# Patient Record
Sex: Female | Born: 1990 | Race: White | Hispanic: No | Marital: Single | Smoking: Current every day smoker
Health system: Southern US, Community
[De-identification: ages and names within clinical notes are randomized; demographics above are authoritative.]

## PROBLEM LIST (undated history)

## (undated) DIAGNOSIS — J45909 Unspecified asthma, uncomplicated: Secondary | ICD-10-CM

## (undated) HISTORY — PX: EYE SURGERY: SHX253

---

## 2018-07-16 ENCOUNTER — Other Ambulatory Visit: Payer: Self-pay | Admitting: Obstetrics & Gynecology

## 2018-09-01 ENCOUNTER — Inpatient Hospital Stay (HOSPITAL_COMMUNITY): Payer: Self-pay

## 2018-09-01 ENCOUNTER — Inpatient Hospital Stay (HOSPITAL_COMMUNITY)
Admission: AD | Admit: 2018-09-01 | Discharge: 2018-09-01 | Disposition: A | Discharge status: Home / Self Care | Payer: Self-pay | Attending: Obstetrics and Gynecology | Admitting: Obstetrics and Gynecology

## 2018-09-01 ENCOUNTER — Encounter (HOSPITAL_COMMUNITY): Payer: Self-pay

## 2018-09-01 ENCOUNTER — Other Ambulatory Visit: Payer: Self-pay

## 2018-09-01 DIAGNOSIS — O468X1 Other antepartum hemorrhage, first trimester: Secondary | ICD-10-CM

## 2018-09-01 DIAGNOSIS — O3680X0 Pregnancy with inconclusive fetal viability, not applicable or unspecified: Secondary | ICD-10-CM

## 2018-09-01 DIAGNOSIS — Z3A11 11 weeks gestation of pregnancy: Secondary | ICD-10-CM | POA: Insufficient documentation

## 2018-09-01 DIAGNOSIS — O209 Hemorrhage in early pregnancy, unspecified: Secondary | ICD-10-CM

## 2018-09-01 DIAGNOSIS — O99331 Smoking (tobacco) complicating pregnancy, first trimester: Secondary | ICD-10-CM | POA: Insufficient documentation

## 2018-09-01 DIAGNOSIS — F17210 Nicotine dependence, cigarettes, uncomplicated: Secondary | ICD-10-CM | POA: Insufficient documentation

## 2018-09-01 DIAGNOSIS — O418X10 Other specified disorders of amniotic fluid and membranes, first trimester, not applicable or unspecified: Secondary | ICD-10-CM

## 2018-09-01 DIAGNOSIS — O208 Other hemorrhage in early pregnancy: Secondary | ICD-10-CM | POA: Insufficient documentation

## 2018-09-01 HISTORY — DX: Unspecified asthma, uncomplicated: J45.909

## 2018-09-01 LAB — CBC WITH DIFFERENTIAL/PLATELET
Abs Immature Granulocytes: 0.03 10*3/uL (ref 0.00–0.07)
Basophils Absolute: 0 10*3/uL (ref 0.0–0.1)
Basophils Relative: 1 %
Eosinophils Absolute: 0 10*3/uL (ref 0.0–0.5)
Eosinophils Relative: 0 %
HCT: 38.1 % (ref 36.0–46.0)
Hemoglobin: 12.8 g/dL (ref 12.0–15.0)
Immature Granulocytes: 0 %
Lymphocytes Relative: 25 %
Lymphs Abs: 2.2 10*3/uL (ref 0.7–4.0)
MCH: 30.5 pg (ref 26.0–34.0)
MCHC: 33.6 g/dL (ref 30.0–36.0)
MCV: 90.7 fL (ref 80.0–100.0)
Monocytes Absolute: 0.6 10*3/uL (ref 0.1–1.0)
Monocytes Relative: 6 %
Neutro Abs: 5.7 10*3/uL (ref 1.7–7.7)
Neutrophils Relative %: 68 %
Platelets: 243 10*3/uL (ref 150–400)
RBC: 4.2 MIL/uL (ref 3.87–5.11)
RDW: 12.5 % (ref 11.5–15.5)
WBC: 8.6 10*3/uL (ref 4.0–10.5)
nRBC: 0 % (ref 0.0–0.2)

## 2018-09-01 LAB — URINALYSIS, MICROSCOPIC (REFLEX): Bacteria, UA: NONE SEEN

## 2018-09-01 LAB — COMPREHENSIVE METABOLIC PANEL
ALT: 15 U/L (ref 0–44)
AST: 18 U/L (ref 15–41)
Albumin: 3.8 g/dL (ref 3.5–5.0)
Alkaline Phosphatase: 64 U/L (ref 38–126)
Anion gap: 11 (ref 5–15)
BUN: 7 mg/dL (ref 6–20)
CO2: 22 mmol/L (ref 22–32)
Calcium: 9.3 mg/dL (ref 8.9–10.3)
Chloride: 104 mmol/L (ref 98–111)
Creatinine, Ser: 0.7 mg/dL (ref 0.44–1.00)
GFR calc Af Amer: >60 mL/min (ref >60)
GFR calc non Af Amer: >60 mL/min (ref >60)
Glucose, Bld: 106 mg/dL — ABNORMAL HIGH (ref 70–99)
Potassium: 3.7 mmol/L (ref 3.5–5.1)
Sodium: 137 mmol/L (ref 135–145)
Total Bilirubin: 1 mg/dL (ref 0.3–1.2)
Total Protein: 6.4 g/dL — ABNORMAL LOW (ref 6.5–8.1)

## 2018-09-01 LAB — URINALYSIS, ROUTINE W REFLEX MICROSCOPIC
Bilirubin Urine: NEGATIVE
Glucose, UA: NEGATIVE mg/dL
Ketones, ur: NEGATIVE mg/dL
Nitrite: NEGATIVE
Protein, ur: NEGATIVE mg/dL
Specific Gravity, Urine: 1.02 (ref 1.005–1.030)
pH: 7 (ref 5.0–8.0)

## 2018-09-01 LAB — ABO/RH: ABO/RH(D): O POS

## 2018-09-01 LAB — HCG, QUANTITATIVE, PREGNANCY: hCG, Beta Chain, Quant, S: 10037 m[IU]/mL — ABNORMAL HIGH (ref <5)

## 2018-09-01 NOTE — Discharge Instructions (Signed)
Subchorionic Hematoma ° °A subchorionic hematoma is a gathering of blood between the outer wall of the embryo (chorion) and the inner wall of the womb (uterus). °This condition can cause vaginal bleeding. If they cause little or no vaginal bleeding, early small hematomas usually shrink on their own and do not affect your baby or pregnancy. When bleeding starts later in pregnancy, or if the hematoma is larger or occurs in older pregnant women, the condition may be more serious. Larger hematomas may get bigger, which increases the chances of miscarriage. This condition also increases the risk of: °· Premature separation of the placenta from the uterus. °· Premature (preterm) labor. °· Stillbirth. °What are the causes? °The exact cause of this condition is not known. It occurs when blood is trapped between the placenta and the uterine wall because the placenta has separated from the original site of implantation. °What increases the risk? °You are more likely to develop this condition if: °· You were treated with fertility medicines. °· You conceived through in vitro fertilization (IVF). °What are the signs or symptoms? °Symptoms of this condition include: °· Vaginal spotting or bleeding. °· Contractions of the uterus. These cause abdominal pain. °Sometimes you may have no symptoms and the bleeding may only be seen when ultrasound images are taken (transvaginal ultrasound). °How is this diagnosed? °This condition is diagnosed based on a physical exam. This includes a pelvic exam. You may also have other tests, including: °· Blood tests. °· Urine tests. °· Ultrasound of the abdomen. °How is this treated? °Treatment for this condition can vary. Treatment may include: °· Watchful waiting. You will be monitored closely for any changes in bleeding. During this stage: °? The hematoma may be reabsorbed by the body. °? The hematoma may separate the fluid-filled space containing the embryo (gestational sac) from the wall of the  womb (endometrium). °· Medicines. °· Activity restriction. This may be needed until the bleeding stops. °Follow these instructions at home: °· Stay on bed rest if told to do so by your health care provider. °· Do not lift anything that is heavier than 10 lbs. (4.5 kg) or as told by your health care provider. °· Do not use any products that contain nicotine or tobacco, such as cigarettes and e-cigarettes. If you need help quitting, ask your health care provider. °· Track and write down the number of pads you use each day and how soaked (saturated) they are. °· Do not use tampons. °· Keep all follow-up visits as told by your health care provider. This is important. Your health care provider may ask you to have follow-up blood tests or ultrasound tests or both. °Contact a health care provider if: °· You have any vaginal bleeding. °· You have a fever. °Get help right away if: °· You have severe cramps in your stomach, back, abdomen, or pelvis. °· You pass large clots or tissue. Save any tissue for your health care provider to look at. °· You have more vaginal bleeding, and you faint or become lightheaded or weak. °Summary °· A subchorionic hematoma is a gathering of blood between the outer wall of the placenta and the uterus. °· This condition can cause vaginal bleeding. °· Sometimes you may have no symptoms and the bleeding may only be seen when ultrasound images are taken. °· Treatment may include watchful waiting, medicines, or activity restriction. °This information is not intended to replace advice given to you by your health care provider. Make sure you discuss any questions you   have with your health care provider. °Document Released: 04/06/2006 Document Revised: 12/02/2016 Document Reviewed: 02/16/2016 °Elsevier Patient Education © 2020 Elsevier Inc. ° °

## 2018-09-01 NOTE — MAU Provider Note (Signed)
History     CSN: [id number]  Arrival date and time: 09/01/18 1423   None     Chief Complaint  Patient presents with  . Abdominal Pain  . Vaginal Bleeding   HPI   Ms.Tamara Simpson is a 28 y.o. female [phone] @ [redacted]w[redacted]d here in MAU with vaginal bleeding. She first noticed the bleeding on Wednesday evening after intercourse. Then the bleeding continued to get heavier over the course of the week. She also attests to mild cramping in her lower abdomen that comes and goes. She currently rates her pain 6/10.   OB History    Gravida  4   Para  2   Term  2   Preterm      AB  1   Living  2     SAB  1   TAB      Ectopic      Multiple      Live Births  2           Past Medical History:  Diagnosis Date  . Asthma     Past Surgical History:  Procedure Laterality Date  . CESAREAN SECTION    . EYE SURGERY      History reviewed. No pertinent family history.  Social History   Tobacco Use  . Smoking status: Current Every Day Smoker    Packs/day: 0.50    Types: Cigarettes  . Smokeless tobacco: Never Used  Substance Use Topics  . Alcohol use: Not Currently  . Drug use: Never    Allergies: No Known Allergies  No medications prior to admission.   Results for orders placed or performed during the hospital encounter of 09/01/18 (from the past 48 hour(s))  Urinalysis, Routine w reflex microscopic     Status: Abnormal   Collection Time: 09/01/18  3:26 PM  Result Value Ref Range   Color, Urine YELLOW YELLOW   APPearance CLOUDY (A) CLEAR   Specific Gravity, Urine 1.020 1.005 - 1.030   pH 7.0 5.0 - 8.0   Glucose, UA NEGATIVE NEGATIVE mg/dL   Hgb urine dipstick LARGE (A) NEGATIVE   Bilirubin Urine NEGATIVE NEGATIVE   Ketones, ur NEGATIVE NEGATIVE mg/dL   Protein, ur NEGATIVE NEGATIVE mg/dL   Nitrite NEGATIVE NEGATIVE   Leukocytes,Ua TRACE (A) NEGATIVE    Comment: Performed at [facility] [address] [place], [place] [address]  Urinalysis,  Microscopic (reflex)     Status: None   Collection Time: 09/01/18  3:26 PM  Result Value Ref Range   RBC / HPF 0-5 0 - 5 RBC/hpf   WBC, UA 0-5 0 - 5 WBC/hpf   Bacteria, UA NONE SEEN NONE SEEN   Squamous Epithelial / LPF 6-10 0 - 5   Amorphous Crystal PRESENT    Urine-Other LESS THAN 10 mL OF URINE SUBMITTED     Comment: Performed at [facility], [place] [address] [place], [place] [address]  CBC with Differential/Platelet     Status: None   Collection Time: 09/01/18  4:34 PM  Result Value Ref Range   WBC 8.6 4.0 - 10.5 K/uL   RBC 4.20 3.87 - 5.11 MIL/uL   Hemoglobin 12.8 12.0 - 15.0 g/dL   HCT 38.1 36.0 - 46.0 %   MCV 90.7 80.0 - 100.0 fL   MCH 30.5 26.0 - 34.0 pg   MCHC 33.6 30.0 - 36.0 g/dL   RDW 12.5 11.5 - 15.5 %   Platelets 243 150 - 400  K/uL   nRBC 0.0 0.0 - 0.2 %   Neutrophils Relative % 68 %   Neutro Abs 5.7 1.7 - 7.7 K/uL   Lymphocytes Relative 25 %   Lymphs Abs 2.2 0.7 - 4.0 K/uL   Monocytes Relative 6 %   Monocytes Absolute 0.6 0.1 - 1.0 K/uL   Eosinophils Relative 0 %   Eosinophils Absolute 0.0 0.0 - 0.5 K/uL   Basophils Relative 1 %   Basophils Absolute 0.0 0.0 - 0.1 K/uL   Immature Granulocytes 0 %   Abs Immature Granulocytes 0.03 0.00 - 0.07 K/uL    Comment: Performed at [facility], [address] [place], [place] [address]  Comprehensive metabolic panel     Status: Abnormal   Collection Time: 09/01/18  4:34 PM  Result Value Ref Range   Sodium 137 135 - 145 mmol/L   Potassium 3.7 3.5 - 5.1 mmol/L   Chloride 104 98 - 111 mmol/L   CO2 22 22 - 32 mmol/L   Glucose, Bld 106 (H) 70 - 99 mg/dL   BUN 7 6 - 20 mg/dL   Creatinine, Ser 6.040.70 0.44 - 1.00 mg/dL   Calcium 9.3 8.9 - 54.010.3 mg/dL   Total Protein 6.4 (L) 6.5 - 8.1 g/dL   Albumin 3.8 3.5 - 5.0 g/dL   AST 18 15 - 41 U/L   ALT 15 0 - 44 U/L   Alkaline Phosphatase 64 38 - 126 U/L   Total Bilirubin 1.0 0.3 - 1.2 mg/dL   GFR calc non Af Amer >60 >60 mL/min   GFR calc Af Amer >60 >60 mL/min    Anion gap 11 5 - 15    Comment: Performed at [facility], [address] [place], [place] [address]  ABO/Rh     Status: None   Collection Time: 09/01/18  4:34 PM  Result Value Ref Range   ABO/RH(D) O POS    No rh immune globuloin      NOT A RH IMMUNE GLOBULIN CANDIDATE, PT RH POSITIVE Performed at [facility], [address] [place], [place] [address]   hCG, quantitative, pregnancy     Status: Abnormal   Collection Time: 09/01/18  4:34 PM  Result Value Ref Range   hCG, Beta Chain, Quant, S 10,037 (H) <5 mIU/mL    Comment:          GEST. AGE      CONC.  (mIU/mL)   <=1 WEEK        5 - 50     2 WEEKS       50 - 500     3 WEEKS       100 - 10,000     4 WEEKS     1,000 - 30,000     5 WEEKS     3,500 - 115,000   6-8 WEEKS     12,000 - 270,000    12 WEEKS     15,000 - 220,000        FEMALE AND NON-PREGNANT FEMALE:     LESS THAN 5 mIU/mL Performed at [facility], [address] [place], [place] [address]    [place] Ob Less Than 14 Weeks With Ob Transvaginal  Result Date: 09/01/2018 CLINICAL DATA:  Pregnant patient with bleeding. EXAM: OBSTETRIC <14 WK US AND TRANSVAGINAL OB US TECHNIQUE: Both transabdominal and transvaginal ultrasound examinations were performed for complete evaluation of the gestation as well as the maternal  uterus, adnexal regions, and pelvic cul-de-sac. Transvaginal technique was performed to assess early pregnancy. COMPARISON:  None. FINDINGS: Intrauterine gestational sac: Single Yolk sac:  Not Visualized. Embryo:  Not Visualized. MSD: 10.22 mm   5 w   5 d CRL:    mm    w    d                  [place] EDC: Subchorionic hemorrhage:  There is a small subchorionic hemorrhage. Maternal uterus/adnexae: The ovaries are normal in appearance. IMPRESSION: 1. There is a gestational sac with a mean sac diameter of 10.22 mm. Probable early intrauterine gestational sac, but no yolk sac, fetal pole, or cardiac activity yet visualized. Recommend follow-up quantitative B-HCG  levels and follow-up US in 14 days to assess viability. This recommendation follows SRU consensus guidelines: Diagnostic Criteria for Nonviable Pregnancy Early in the First Trimester. Malva Limes Med 2013; 161:0960-45. 2. Small subchorionic hemorrhage. 3. No other abnormalities. Electronically Signed   By: Gerome Sam III M.D   On: 09/01/2018 17:52    Review of Systems  Constitutional: Negative for fever.  Gastrointestinal: Negative for diarrhea and nausea.  Genitourinary: Negative for dysuria.   Physical Exam   Blood pressure 107/60, pulse 91, temperature 98.5 F (36.9 C), temperature source Oral, resp. rate 16, height 5\' 4"  (1.626 m), weight 77.7 kg, last menstrual period 06/12/2018, SpO2 99 %.  Physical Exam  Constitutional: She is oriented to person, place, and time. She appears well-developed and well-nourished. No distress.  HENT:  Head: Normocephalic.  GI: Soft. There is abdominal tenderness in the right lower quadrant, suprapubic area and left lower quadrant. There is no rigidity, no rebound and no guarding.  Genitourinary:    Genitourinary Comments: Cervix: closed, thick, anterior. Small pink blood noted on exam glove.    Musculoskeletal: Normal range of motion.  Neurological: She is alert and oriented to person, place, and time.  Skin: She is not diaphoretic.    MAU Course  Procedures  None  MDM  O positive blood type HIV, CBC, Hcg, ABO US OB transvaginal  Patient declined STI testing, says she just had it done on Thursday.  Assessment and Plan   A:  1. Pregnancy of unknown anatomic location   2. [redacted] weeks gestation of pregnancy   3. Vaginal bleeding in pregnancy, first trimester   4. Subchorionic hematoma in first trimester, single or unspecified fetus     P:  Discharge home in stable condition Return to the [place] office on Tuesday @ 0900 for repeat blood work Return to MAU if symptoms worsen  SAB precautions Reviewed [place] in detail with the  patient.  Venia Carbon I, NP 09/01/2018 6:30 PM

## 2018-09-01 NOTE — MAU Note (Signed)
Tamara Simpson is a 28 y.o. at [redacted]w[redacted]d here in MAU reporting: started bleeding yesterday. Started as spotting, now it is a little bit heavier. States she is not having to wear a pad, only noticed bleeding on toilet paper when she wipes. Started cramping a couple hours ago. Was seen in [place] on Thursday for confirmation of pregnancy, has not had u/s yet.  LMP: 06/12/18  Onset of complaint: last night  Pain score: 6/10  Vitals:   09/01/18 1532  BP: 107/60  Pulse: 91  Resp: 16  Temp: 98.5 F (36.9 C)  SpO2: 99%      Lab orders placed from triage: UA, UPT

## 2018-09-02 ENCOUNTER — Other Ambulatory Visit: Payer: Self-pay

## 2018-09-02 ENCOUNTER — Inpatient Hospital Stay (HOSPITAL_COMMUNITY)
Admission: AD | Admit: 2018-09-02 | Discharge: 2018-09-03 | Disposition: A | Discharge status: Home / Self Care | Payer: Medicaid Other | Attending: Obstetrics & Gynecology | Admitting: Obstetrics & Gynecology

## 2018-09-02 ENCOUNTER — Encounter (HOSPITAL_COMMUNITY): Payer: Self-pay

## 2018-09-02 ENCOUNTER — Inpatient Hospital Stay (HOSPITAL_COMMUNITY)
Admission: AD | Admit: 2018-09-02 | Discharge: 2018-09-02 | Discharge status: Against Medical Advice | Payer: Medicaid Other | Attending: Obstetrics & Gynecology | Admitting: Obstetrics & Gynecology

## 2018-09-02 DIAGNOSIS — Z3A11 11 weeks gestation of pregnancy: Secondary | ICD-10-CM | POA: Insufficient documentation

## 2018-09-02 DIAGNOSIS — F17210 Nicotine dependence, cigarettes, uncomplicated: Secondary | ICD-10-CM | POA: Insufficient documentation

## 2018-09-02 DIAGNOSIS — O26891 Other specified pregnancy related conditions, first trimester: Secondary | ICD-10-CM | POA: Insufficient documentation

## 2018-09-02 DIAGNOSIS — R109 Unspecified abdominal pain: Secondary | ICD-10-CM | POA: Insufficient documentation

## 2018-09-02 DIAGNOSIS — O209 Hemorrhage in early pregnancy, unspecified: Secondary | ICD-10-CM | POA: Insufficient documentation

## 2018-09-02 DIAGNOSIS — O99331 Smoking (tobacco) complicating pregnancy, first trimester: Secondary | ICD-10-CM | POA: Insufficient documentation

## 2018-09-02 DIAGNOSIS — Z5321 Procedure and treatment not carried out due to patient leaving prior to being seen by health care provider: Secondary | ICD-10-CM | POA: Insufficient documentation

## 2018-09-02 DIAGNOSIS — O200 Threatened abortion: Secondary | ICD-10-CM

## 2018-09-02 LAB — URINALYSIS, ROUTINE W REFLEX MICROSCOPIC
Bacteria, UA: NONE SEEN
Bilirubin Urine: NEGATIVE
Glucose, UA: NEGATIVE mg/dL
Ketones, ur: NEGATIVE mg/dL
Leukocytes,Ua: NEGATIVE
Nitrite: NEGATIVE
Protein, ur: 100 mg/dL — AB
RBC / HPF: >50 RBC/hpf — ABNORMAL HIGH (ref 0–5)
Specific Gravity, Urine: 1.023 (ref 1.005–1.030)
pH: 6 (ref 5.0–8.0)

## 2018-09-02 NOTE — MAU Note (Signed)
Started having heavier bleeding and worse abdominal cramping tonight.  Did not take anything for the pain.

## 2018-09-02 NOTE — MAU Note (Signed)
Attempted to retrieve patient 4 times from lobby. Patient left without being triaged.

## 2018-09-03 ENCOUNTER — Telehealth: Payer: Self-pay | Admitting: Family Medicine

## 2018-09-03 DIAGNOSIS — O200 Threatened abortion: Secondary | ICD-10-CM

## 2018-09-03 DIAGNOSIS — Z3A11 11 weeks gestation of pregnancy: Secondary | ICD-10-CM

## 2018-09-03 LAB — CULTURE, OB URINE: Special Requests: NORMAL

## 2018-09-03 MED ORDER — OXYCODONE-ACETAMINOPHEN 5-325 MG PO TABS: 1.0000 | ORAL_TABLET | Freq: Four times a day (QID) | ORAL | 0 refills | Status: AC | PRN

## 2018-09-03 NOTE — Telephone Encounter (Signed)
Attempted to call patient about her appointment on 9/1 @ 9:00. No answer left voicemail instructing patient to wear a face mask for the entire appointment and no visitors are allowed during the visit. Patient instructed not to attend the appointment if she was any symptoms. Symptom list and office number left.

## 2018-09-03 NOTE — Discharge Instructions (Signed)
Threatened Miscarriage  A threatened miscarriage occurs when a woman has vaginal bleeding during the first 20 weeks of pregnancy but the pregnancy has not ended. If you have vaginal bleeding during this time, your health care provider will do tests to make sure you are still pregnant. If the tests show that you are still pregnant and that the developing baby (fetus) inside your uterus is still growing, your condition is considered a threatened miscarriage. A threatened miscarriage does not mean your pregnancy will end, but it does increase the risk of losing your pregnancy (complete miscarriage). What are the causes? The cause of this condition is usually not known. For women who go on to have a complete miscarriage, the most common cause is an abnormal number of chromosomes in the developing baby. Chromosomes are the structures inside cells that hold all of a person's genetic material. What increases the risk? The following lifestyle factors may increase your risk of a miscarriage in early pregnancy:  Smoking.  Drinking excessive amounts of alcohol or caffeine.  Recreational drug use. The following preexisting health conditions may increase your risk of a miscarriage in early pregnancy:  Polycystic ovary syndrome.  Uterine fibroids.  Infections.  Diabetes mellitus. What are the signs or symptoms? Symptoms of this condition include:  Vaginal bleeding.  Mild abdominal pain or cramps. How is this diagnosed? If you have bleeding with or without abdominal pain before 20 weeks of pregnancy, your health care provider will do tests to check whether you are still pregnant. These will include:  Ultrasound. This test uses sound waves to create images of the inside of your uterus. This allows your health care provider to look at your developing baby and other structures, such as your placenta.  Pelvic exam. This is an internal exam of your vagina and cervix.  Measurement of your baby's heart  rate.  Laboratory tests such as blood tests, urine tests, or swabs for infection You may be diagnosed with a threatened miscarriage if:  Ultrasound testing shows that you are still pregnant.  Your baby's heart rate is strong.  A pelvic exam shows that the opening between your uterus and your vagina (cervix) is closed.  Blood tests confirm that you are still pregnant. How is this treated? No treatments have been shown to prevent a threatened miscarriage from going on to a complete miscarriage. However, the right home care is important. Follow these instructions at home:  Get plenty of rest.  Do not have sex or use tampons if you have vaginal bleeding.  Do not douche.  Do not smoke or use recreational drugs.  Do not drink alcohol.  Avoid caffeine.  Keep all follow-up prenatal visits as told by your health care provider. This is important. Contact a health care provider if:  You have light vaginal bleeding or spotting while pregnant.  You have abdominal pain or cramping.  You have a fever. Get help right away if:  You have heavy vaginal bleeding.  You have blood clots coming from your vagina.  You pass tissue from your vagina.  You leak fluid, or you have a gush of fluid from your vagina.  You have severe low back pain or abdominal cramps.  You have fever, chills, and severe abdominal pain. Summary  A threatened miscarriage occurs when a woman has vaginal bleeding during the first 20 weeks of pregnancy but the pregnancy has not ended.  The cause of a threatened miscarriage is usually not known.  Symptoms of this condition may   include vaginal bleeding and mild abdominal pain or cramps.  No treatments have been shown to prevent a threatened miscarriage from going on to a complete miscarriage.  Keep all follow-up prenatal visits as told by your health care provider. This is important. This information is not intended to replace advice given to you by your health  care provider. Make sure you discuss any questions you have with your health care provider. Document Released: 12/20/2004 Document Revised: 01/26/2017 Document Reviewed: 03/18/2016 Elsevier Patient Education  2020 Elsevier Inc.  

## 2018-09-03 NOTE — MAU Provider Note (Signed)
History     CSN: [id number]  Arrival date and time: 09/02/18 2342   First Provider Initiated Contact with Patient 09/03/18 0008      Chief Complaint  Patient presents with  . Vaginal Bleeding  . Abdominal Pain   Tamara Simpson is a 28 y.o. [id number] at 6597w6d by LMP.  She presents today for Vaginal Bleeding and Abdominal Pain.  She states she started having her symptoms around 3 or 4pm. She reports she is passing one clot the size of a golf ball and then 2 half the size.  She reports some cramping in her lower abdominal area that is constant and she rates 8.5/10.  She states the pain is not worsened or improved by any known factors.  She reports that she has not taken anything for her pain.       OB History    Gravida  4   Para  2   Term  2   Preterm      AB  1   Living  2     SAB  1   TAB      Ectopic      Multiple      Live Births  2           Past Medical History:  Diagnosis Date  . Asthma     Past Surgical History:  Procedure Laterality Date  . CESAREAN SECTION    . EYE SURGERY      No family history on file.  Social History   Tobacco Use  . Smoking status: Current Every Day Smoker    Packs/day: 0.50    Types: Cigarettes  . Smokeless tobacco: Never Used  Substance Use Topics  . Alcohol use: Not Currently  . Drug use: Never    Allergies: No Known Allergies  No medications prior to admission.    Review of Systems  Constitutional: Negative for chills and fever.  Respiratory: Negative for cough and shortness of breath.   Gastrointestinal: Positive for abdominal pain. Negative for constipation, diarrhea, nausea and vomiting. Abdominal distention: Cramping.  Genitourinary: Positive for vaginal bleeding. Negative for difficulty urinating, dysuria and vaginal discharge.  Musculoskeletal: Positive for back pain (Chronic).  Neurological: Negative for dizziness, light-headedness and headaches.   Physical Exam   Blood pressure 114/62,  pulse 87, temperature 98.4 F (36.9 C), resp. rate 19, weight 78.2 kg, last menstrual period 06/12/2018, SpO2 98 %.  Physical Exam  Constitutional: She is oriented to person, place, and time. She appears well-developed and well-nourished.  HENT:  Head: Normocephalic and atraumatic.  Eyes: Conjunctivae are normal.  Neck: Normal range of motion.  Cardiovascular: Normal rate, regular rhythm and normal heart sounds.  Respiratory: Effort normal and breath sounds normal.  GI: Soft.  Genitourinary:    Vaginal bleeding present.  There is bleeding in the vagina.    Genitourinary Comments: Speculum Exam: -Normal External Genitalia: Non tender, Moderate amt blood noted.  Dime sized clot at introitus-collected -Vaginal Vault: Pink mucosa with good rugae. Large golf ball sized clot removed with ring forceps. Moderate amt blood in vault removed with faux swabs x 3. -Cervix:Pink, no lesions, cysts, or polyps.  Appears closed. Active bleeding from os. -Bimanual Exam:  Deferred    Neurological: She is alert and oriented to person, place, and time.  Skin: Skin is warm and dry.  Psychiatric: She has a normal mood and affect. Her behavior is normal.    MAU Course  Procedures Results for orders  placed or performed during the hospital encounter of 09/02/18 (from the past 24 hour(s))  Urinalysis, Routine w reflex microscopic     Status: Abnormal   Collection Time: 09/02/18  9:08 PM  Result Value Ref Range   Color, Urine AMBER (A) YELLOW   APPearance CLOUDY (A) CLEAR   Specific Gravity, Urine 1.023 1.005 - 1.030   pH 6.0 5.0 - 8.0   Glucose, UA NEGATIVE NEGATIVE mg/dL   Hgb urine dipstick LARGE (A) NEGATIVE   Bilirubin Urine NEGATIVE NEGATIVE   Ketones, ur NEGATIVE NEGATIVE mg/dL   Protein, ur 100 (A) NEGATIVE mg/dL   Nitrite NEGATIVE NEGATIVE   Leukocytes,Ua NEGATIVE NEGATIVE   RBC / HPF >50 (H) 0 - 5 RBC/hpf   WBC, UA 0-5 0 - 5 WBC/hpf   Bacteria, UA NONE SEEN NONE SEEN   Squamous Epithelial /  LPF 0-5 0 - 5   Mucus PRESENT     MDM Pelvic Exam Pain Management Rx Assessment and Plan  28 year old [id number] SIUP at 11.6 weeks by LMP Vaginal Bleeding  -Patient tearful after exam and requests time before discussing findings.  Maryann Conners MSN, CNM 09/03/2018, 12:08 AM   Reassessment (12:43 AM) Threatened Miscarriage   -Patient calls out and states she is ready to speak with provider. -Provider at bedside and patient informed of exam findings. *Vaginal bleeding and clots *Cervix closed, but active bleeding -Reviewed previous US and informed that [facility] was present and bleeding could be result of this. -However, also discussed that based on previous US findings and patient reporting definitive LMP can not rule out miscarriage. Further explained that additional assessment needed, but that this could remain inconclusive tonight requiring further follow up.  -Discussed potential plan of care, tonight, to include repeat lab work, [place], and pain medication. -Patient accepts pain medication, but declines repeat labs and [place] currently. -Patient drove self and unable to get someone to pick her up. Will print Rx for Percocet (5/325) Disp 4, RF 0 for patient to take to 24 hour pharmacy. -Bleeding precautions given. -Encouraged to call or return to MAU if symptoms worsen or with the onset of new symptoms. -Discharged to home in stable condition.  Maryann Conners MSN, CNM

## 2018-09-04 ENCOUNTER — Ambulatory Visit: Payer: Self-pay

## 2021-01-31 IMAGING — US OBSTETRIC <14 WK US AND TRANSVAGINAL OB US
1 series · 15 of 28 positions shown · non-contrast
Comparison: None.

CLINICAL DATA: Pregnant patient with bleeding.

EXAM:
OBSTETRIC <14 WK US AND TRANSVAGINAL OB US
TECHNIQUE: Both transabdominal and transvaginal ultrasound examinations were
performed for complete evaluation of the gestation as well as the
maternal uterus, adnexal regions, and pelvic cul-de-sac.
Transvaginal technique was performed to assess early pregnancy.

[Series 1: obstetric <14 wk us and transvaginal ob us · 15 of 57 slices shown]
[im 1/57]
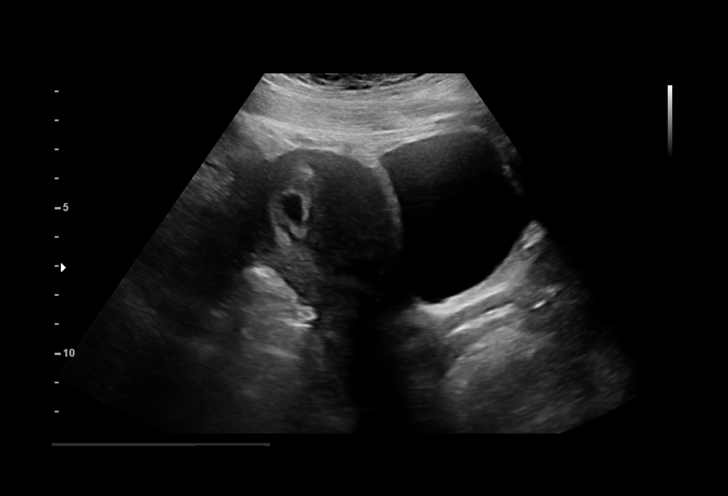
[im 5/57]
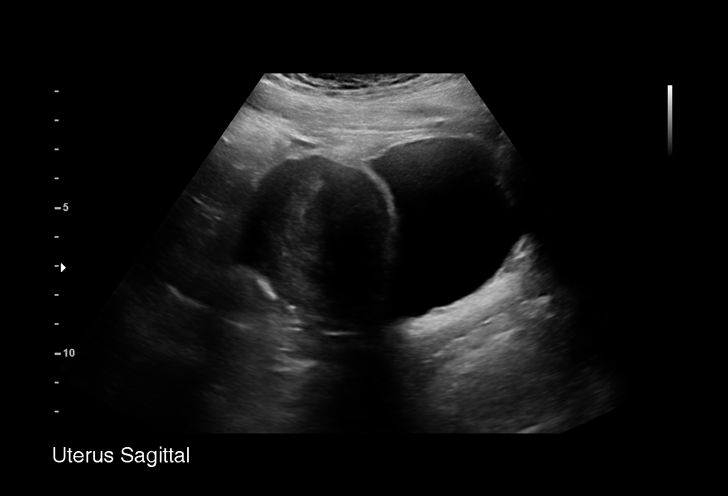
[im 9/57]
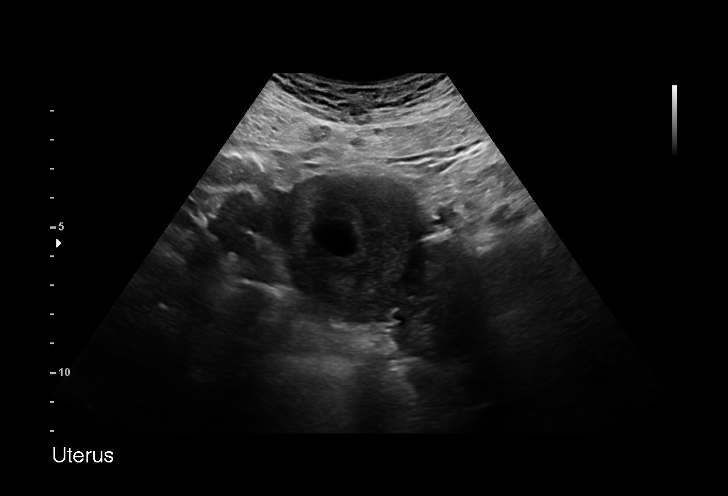
[im 13/57]
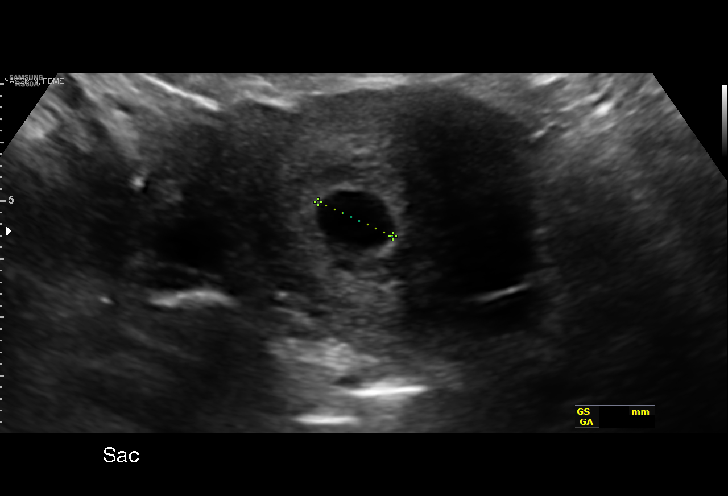
[im 17/57]
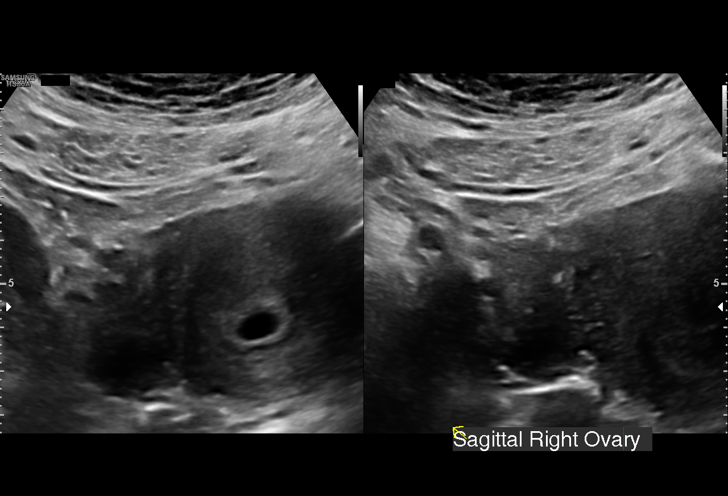
[im 21/57]
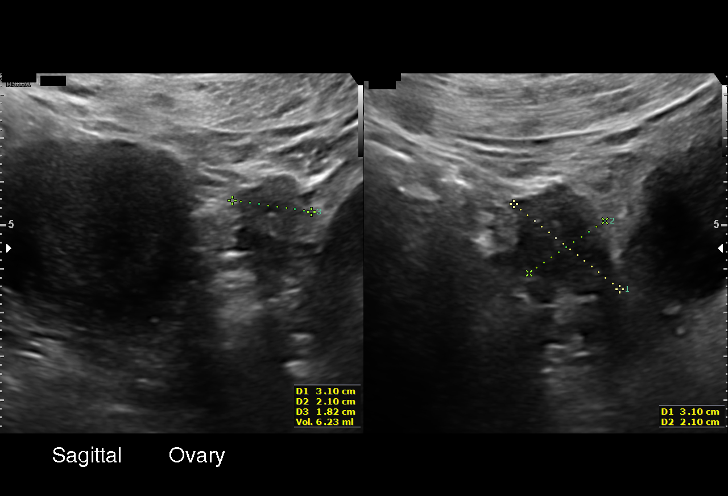
[im 25/57]
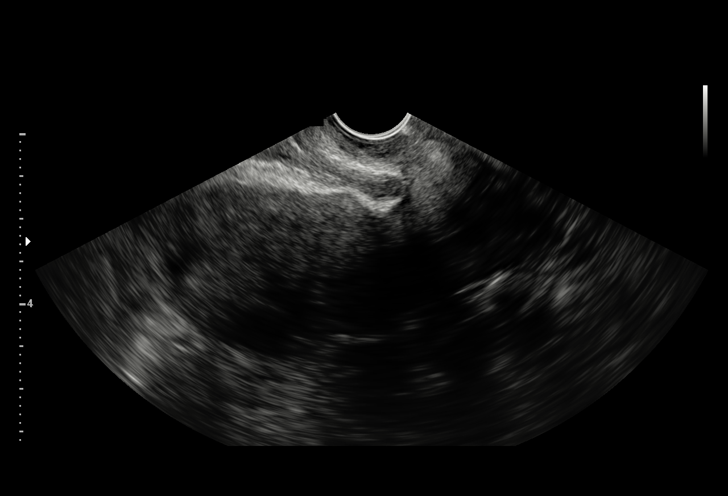
[im 30/57]
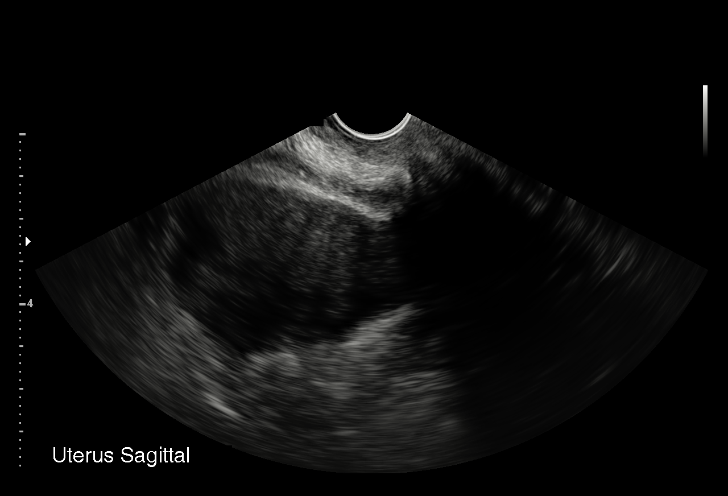
[im 32/57]
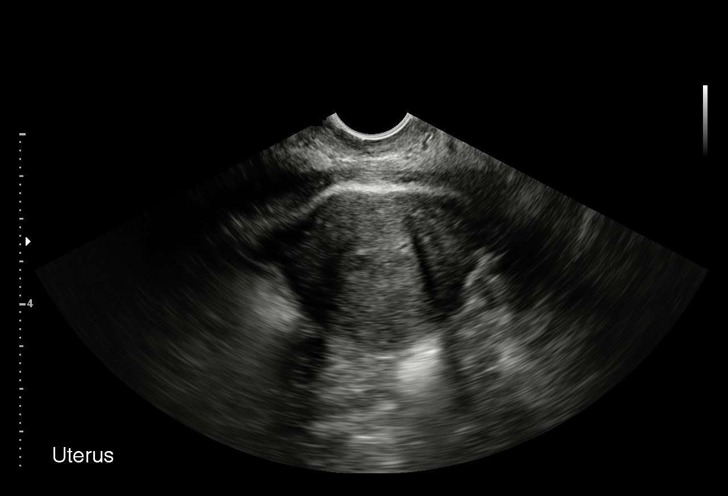
[im 36/57]
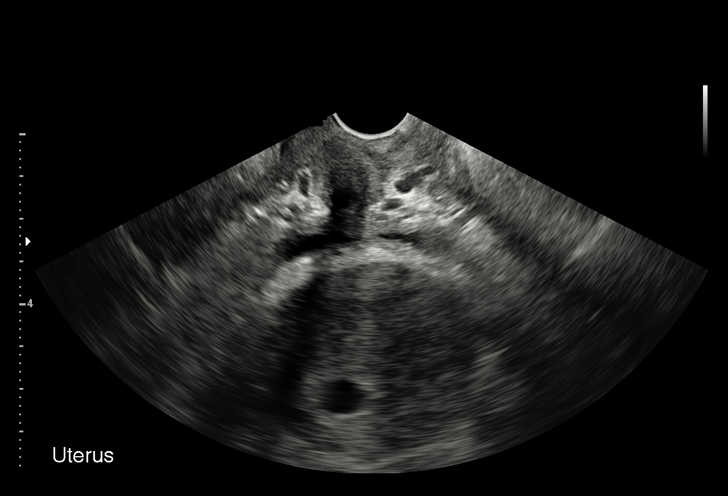
[im 40/57]
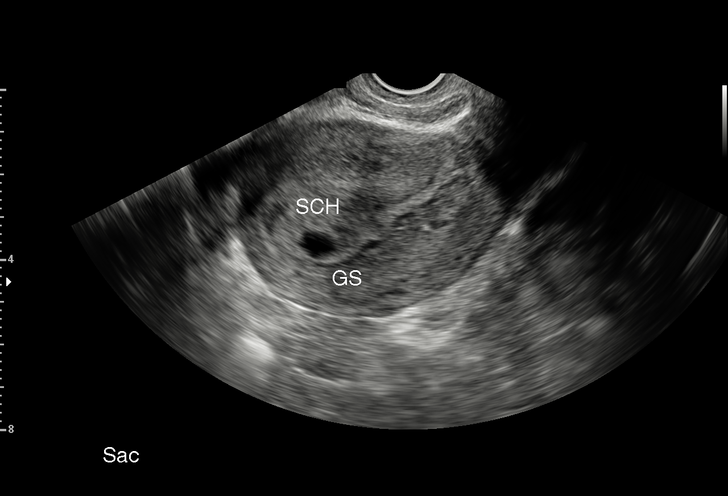
[im 44/57]
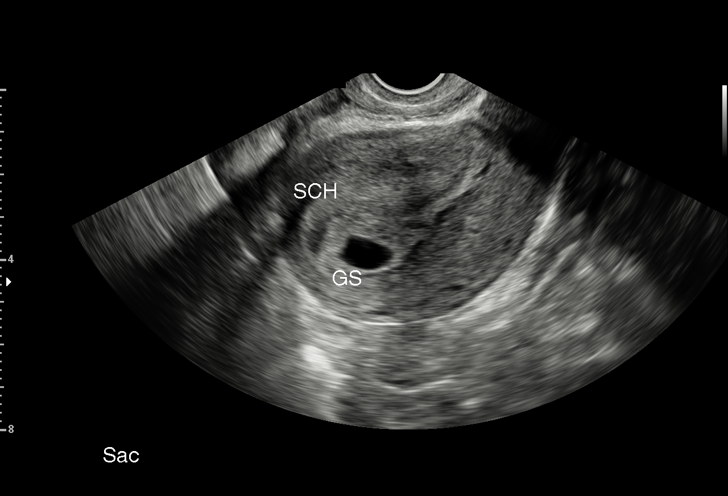
[im 48/57]
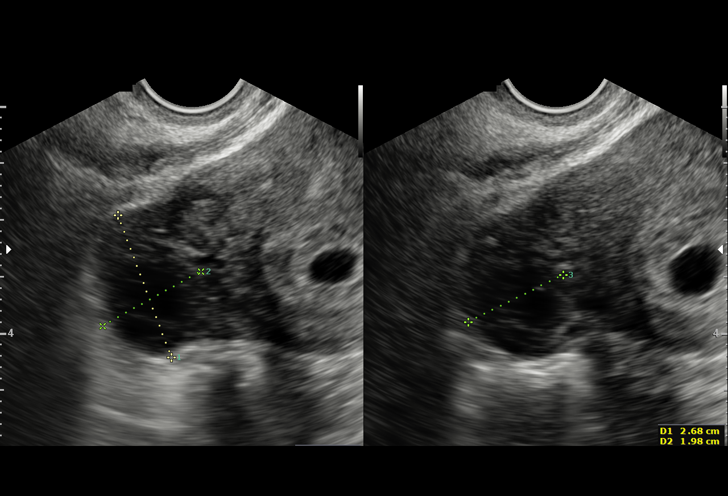
[im 52/57]
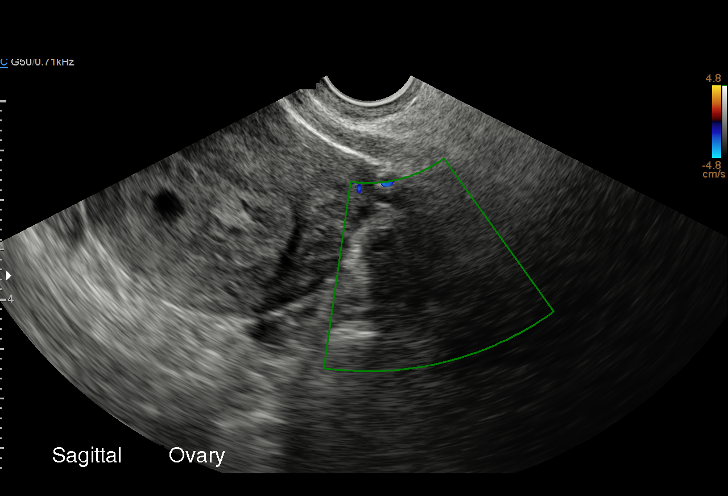
[im 57/57]
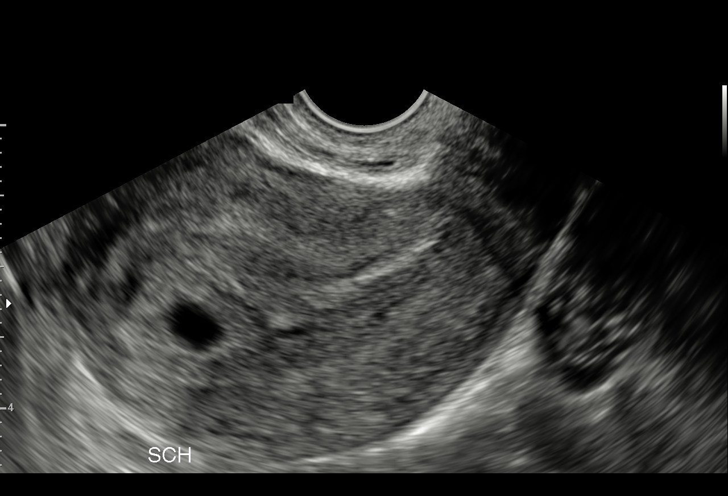

[15 of 28 positions shown; findings below may reference images not displayed]

FINDINGS: Intrauterine gestational sac: Single

Yolk sac:  Not Visualized.

Embryo:  Not Visualized.

MSD: 10.22 mm   5 w   5 d

CRL:    mm    w    d                  US EDC:

Subchorionic hemorrhage:  There is a small subchorionic hemorrhage.

Maternal uterus/adnexae: The ovaries are normal in appearance.
IMPRESSION: 1. There is a gestational sac with a mean sac diameter of 10.22 mm.
Probable early intrauterine gestational sac, but no yolk sac, fetal
pole, or cardiac activity yet visualized. Recommend follow-up
quantitative B-HCG levels and follow-up US in 14 days to assess
viability. This recommendation follows SRU consensus guidelines:
Diagnostic Criteria for Nonviable Pregnancy Early in the First
Trimester. N Engl J Med 2233; [DATE].
2. Small subchorionic hemorrhage.
3. No other abnormalities.

## 2022-03-13 ENCOUNTER — Encounter (HOSPITAL_COMMUNITY): Payer: Self-pay

## 2022-03-13 ENCOUNTER — Other Ambulatory Visit: Payer: Self-pay

## 2022-03-13 ENCOUNTER — Emergency Department (HOSPITAL_COMMUNITY)
Admission: EM | Admit: 2022-03-13 | Discharge: 2022-03-13 | Disposition: A | Discharge status: Home / Self Care | Payer: Medicaid Other | Attending: Emergency Medicine | Admitting: Emergency Medicine

## 2022-03-13 DIAGNOSIS — H66002 Acute suppurative otitis media without spontaneous rupture of ear drum, left ear: Secondary | ICD-10-CM

## 2022-03-13 DIAGNOSIS — H6592 Unspecified nonsuppurative otitis media, left ear: Secondary | ICD-10-CM | POA: Insufficient documentation

## 2022-03-13 DIAGNOSIS — K0889 Other specified disorders of teeth and supporting structures: Secondary | ICD-10-CM | POA: Insufficient documentation

## 2022-03-13 MED ORDER — AMOXICILLIN 500 MG PO CAPS: 500.0000 mg | ORAL_CAPSULE | Freq: Three times a day (TID) | ORAL | 0 refills | Status: DC

## 2022-03-13 NOTE — Discharge Instructions (Signed)
You were evaluated in the emergency department, we found you have an ear infection on the left side.  I am sending you home on a week of antibiotics.  Please take as prescribed, please follow-up with your PCP in 1 week for reevaluation.  Please return to the ED if you have worsening ear pain, pain behind your ear, any confusion or high fevers.

## 2022-03-13 NOTE — ED Provider Notes (Signed)
[facility] Provider Note   CSN: [id number] Arrival date & time: 03/13/22  1527     History  Chief Complaint  Patient presents with   Ear Pain   Dental Pain    Tamara Simpson is a 32 y.o. female.  This is a 32 year old female presenting to the ED for left ear pain and dental pain.  Patient states 5 days ago she started experiencing left ear ache which has progressively worsened.  She denies any fevers or chills, she now reports that the pain radiates into her left side of her mouth and she is experiencing some dental pain.  She denies any chest pain or shortness of breath, no difficulty swallowing, she states she does not have any decreased hearing.        Home Medications Prior to Admission medications   Medication Sig Start Date End Date Taking? Authorizing Provider  oxyCODONE-acetaminophen (PERCOCET/ROXICET) 5-325 MG tablet Take 1-2 tablets by mouth every 6 (six) hours as needed for severe pain. 09/03/18   Gavin Pound, CNM      Allergies    Patient has no known allergies.    Review of Systems   Review of Systems  Constitutional:  Negative for chills, fatigue and fever.  HENT:  Positive for dental problem and ear pain. Negative for ear discharge, sore throat, tinnitus, trouble swallowing and voice change.   Respiratory:  Negative for shortness of breath.   Cardiovascular:  Negative for chest pain.    Physical Exam Updated Vital Signs BP (!) 126/90   Pulse 94   Temp 98.1 F (36.7 C) (Oral)   Resp 16   SpO2 98%  Physical Exam Vitals and nursing note reviewed.  Constitutional:      General: She is not in acute distress.    Appearance: Normal appearance. She is not ill-appearing or toxic-appearing.  HENT:     Head: Normocephalic and atraumatic.     Comments: Broken tooth in the left lower molars, no obvious periapical abscess    Right Ear: Tympanic membrane normal.     Left Ear: External ear normal. No laceration  or drainage. No mastoid tenderness. Tympanic membrane is erythematous and bulging.  Cardiovascular:     Rate and Rhythm: Normal rate and regular rhythm.     Pulses: Normal pulses.     Heart sounds: No murmur heard.    No friction rub. No gallop.  Pulmonary:     Effort: Pulmonary effort is normal. No respiratory distress.     Breath sounds: No wheezing or rales.  Chest:     Chest wall: No tenderness.  Abdominal:     General: There is no distension.     Palpations: Abdomen is soft.     Tenderness: There is no abdominal tenderness. There is no guarding.  Skin:    Capillary Refill: Capillary refill takes less than 2 seconds.  Neurological:     General: No focal deficit present.     Mental Status: She is alert and oriented to person, place, and time.     ED Results / Procedures / Treatments   Labs (all labs ordered are listed, but only abnormal results are displayed) Labs Reviewed - No data to display  EKG None  Radiology No results found.  Procedures Procedures    Medications Ordered in ED Medications - No data to display  ED Course/ Medical Decision Making/ A&P  Medical Decision Making Patient presents with ear pain and some dental pain.  Differential diagnosis includes acute otitis media, otitis externa, mastoiditis, periapical abscess.  On exam patient has no tenderness over the mastoid process, no swelling, no ear protrusion, low suspicion for acute mastoiditis.  She has no palpable periapical abscess, tooth is not tender to percussion, there is no redness or purulence, low suspicion for abscess at this time.  Left TM appears erythematous and bulging, this is likely a acute otitis media.  Will put treat patient with amoxicillin and have her follow-up with her PCP outpatient.  Return precautions given if she has continued or worsening pain, pain or swelling behind her ear, confusion, or high fevers to return to the ED for evaluation.  Patient  was stable at discharge.  Problems Addressed: Non-recurrent acute suppurative otitis media of left ear without spontaneous rupture of tympanic membrane: complicated acute illness or injury          Final Clinical Impression(s) / ED Diagnoses Final diagnoses:  None    Rx / DC Orders ED Discharge Orders     None         Jimmie Molly, MD 03/13/22 1610    Elnora Morrison, MD 03/16/22 [phone]

## 2022-03-13 NOTE — ED Triage Notes (Signed)
Pt reports left sided ear pain and left lower dental pain for the past several days. Denies fever

## 2022-03-15 ENCOUNTER — Emergency Department (HOSPITAL_COMMUNITY)
Admission: EM | Admit: 2022-03-15 | Discharge: 2022-03-15 | Disposition: A | Discharge status: Home / Self Care | Payer: Self-pay | Attending: Student | Admitting: Student

## 2022-03-15 ENCOUNTER — Other Ambulatory Visit: Payer: Self-pay

## 2022-03-15 DIAGNOSIS — K0889 Other specified disorders of teeth and supporting structures: Secondary | ICD-10-CM | POA: Insufficient documentation

## 2022-03-15 MED ORDER — AMOXICILLIN-POT CLAVULANATE 875-125 MG PO TABS: 1.0000 | ORAL_TABLET | Freq: Two times a day (BID) | ORAL | 0 refills | Status: AC

## 2022-03-15 NOTE — ED Triage Notes (Signed)
Pt reports pain in lower jaw/tooth area since Wednesday, feels like she is swelling in her (left) neck area, and hurts to swallow. Denies fevers. She has been on amoxicillin for ear infection.

## 2022-03-15 NOTE — Discharge Instructions (Signed)
I have changed her antibiotics from amoxicillin to Augmentin, please start taking this medication, I would expect things to improve the after 3 to 4 days of antibiotic use, I recommend ibuprofen and or Tylenol every 4-6 hours needed for pain and swelling.  I given you a dentist please call for further evaluation, as well as a list of dentists within the area.  Come back to the emergency apartment if you develop difficulty swallowing on saliva, change in voice, swelling that protrudes into the eye, difficulty with eye movement, fevers, or you feel like your symptoms are simply are getting worse.

## 2022-03-15 NOTE — ED Provider Notes (Signed)
  [facility] Provider Note   CSN: [id number] Arrival date & time: 03/15/22  2227     History {Add pertinent medical, surgical, social history, OB history to HPI:1} Chief Complaint  Patient presents with   Dental Pain    Tamara Simpson is a 32 y.o. female.  HPI     Home Medications Prior to Admission medications   Medication Sig Start Date End Date Taking? Authorizing Provider  amoxicillin-clavulanate (AUGMENTIN) 875-125 MG tablet Take 1 tablet by mouth every 12 (twelve) hours for 7 days. 03/15/22 03/22/22 Yes Marcello Fennel, PA-C  oxyCODONE-acetaminophen (PERCOCET/ROXICET) 5-325 MG tablet Take 1-2 tablets by mouth every 6 (six) hours as needed for severe pain. Patient not taking: Reported on 03/15/2022 09/03/18   Gavin Pound, CNM      Allergies    Patient has no known allergies.    Review of Systems   Review of Systems  Physical Exam Updated Vital Signs BP 129/88   Pulse 79   Temp 98.9 F (37.2 C) (Oral)   Resp 18   LMP 03/08/2022   SpO2 100%  Physical Exam  ED Results / Procedures / Treatments   Labs (all labs ordered are listed, but only abnormal results are displayed) Labs Reviewed - No data to display  EKG None  Radiology No results found.  Procedures Procedures  {Document cardiac monitor, telemetry assessment procedure when appropriate:1}  Medications Ordered in ED Medications - No data to display  ED Course/ Medical Decision Making/ A&P   {   Click here for ABCD2, HEART and other calculatorsREFRESH Note before signing :1}                          Medical Decision Making Risk Prescription drug management.   ***  {Document critical care time when appropriate:1} {Document review of labs and clinical decision tools ie heart score, Chads2Vasc2 etc:1}  {Document your independent review of radiology images, and any outside records:1} {Document your discussion with family members, caretakers,  and with consultants:1} {Document social determinants of health affecting pt's care:1} {Document your decision making why or why not admission, treatments were needed:1} Final Clinical Impression(s) / ED Diagnoses Final diagnoses:  Pain, dental    Rx / DC Orders ED Discharge Orders          Ordered    amoxicillin-clavulanate (AUGMENTIN) 875-125 MG tablet  Every 12 hours        03/15/22 2255

## 2023-02-15 ENCOUNTER — Ambulatory Visit
Admission: EM | Admit: 2023-02-15 | Discharge: 2023-02-15 | Disposition: A | Discharge status: Home / Self Care | Payer: Medicaid Other | Attending: Family Medicine | Admitting: Family Medicine

## 2023-02-15 ENCOUNTER — Other Ambulatory Visit: Payer: Self-pay

## 2023-02-15 DIAGNOSIS — Z113 Encounter for screening for infections with a predominantly sexual mode of transmission: Secondary | ICD-10-CM | POA: Insufficient documentation

## 2023-02-15 DIAGNOSIS — N390 Urinary tract infection, site not specified: Secondary | ICD-10-CM

## 2023-02-15 DIAGNOSIS — R319 Hematuria, unspecified: Secondary | ICD-10-CM | POA: Diagnosis not present

## 2023-02-15 LAB — POCT URINALYSIS DIP (MANUAL ENTRY)
Bilirubin, UA: NEGATIVE
Glucose, UA: NEGATIVE mg/dL
Ketones, POC UA: NEGATIVE mg/dL
Nitrite, UA: POSITIVE — AB
Protein Ur, POC: 30 mg/dL — AB
Spec Grav, UA: 1.02 (ref 1.010–1.025)
Urobilinogen, UA: 0.2 U/dL
pH, UA: 5.5 (ref 5.0–8.0)

## 2023-02-15 LAB — POCT URINE PREGNANCY: Preg Test, Ur: NEGATIVE

## 2023-02-15 MED ORDER — SULFAMETHOXAZOLE-TRIMETHOPRIM 800-160 MG PO TABS: 1.0000 | ORAL_TABLET | Freq: Two times a day (BID) | ORAL | 0 refills | Status: AC

## 2023-02-15 NOTE — Discharge Instructions (Signed)
Test results will be released to your MyChart account We will contact you if anything is positive and requires treatment.

## 2023-02-15 NOTE — ED Notes (Signed)
Unable to obtain pt's blood at this time. Pt states she is typically a hard stick and has to go in her hands. This RN was unsuccessful with two attempts.

## 2023-02-15 NOTE — ED Triage Notes (Signed)
Pt presents with complaints of burning with urination x 4 days. Pt has been taking the OTC AZO tablets with pain relief. Pt currently rates her overall pain a 2/10, increases with urination. Ibuprofen and AZO tablet taken PTA.   Pt would also like to be tested for STD's. Pt states she is wanting the swab in addition to the blood work.

## 2023-02-15 NOTE — ED Notes (Signed)
[facility] outpatient order placed. Pt verbalized understanding.

## 2023-02-15 NOTE — ED Provider Notes (Signed)
[facility]    CSN: [id number] Arrival date & time: 02/15/23  1628      History   Chief Complaint Chief Complaint  Patient presents with   Burning with Urination    SEXUALLY TRANSMITTED DISEASE    HPI Tamara Simpson is a 33 y.o. female.   HPI Suspected urinary tract infections symptom onset 4 days ago symptoms include dysuria and hematuria.  Denies frequency, urgency, fever, chills, sweats, nausea, vomiting, vaginal discharge or vaginal odor, genital rash.  She would like STD testing including the blood work. She has been taking over-the-counter Azo without relief.  Past Medical History:  Diagnosis Date   Asthma     There are no active problems to display for this patient.   Past Surgical History:  Procedure Laterality Date   CESAREAN SECTION     EYE SURGERY      OB History     Gravida  4   Para  2   Term  2   Preterm      AB  1   Living  2      SAB  1   IAB      Ectopic      Multiple      Live Births  2            Home Medications    Prior to Admission medications   Medication Sig Start Date End Date Taking? Authorizing Provider  oxyCODONE-acetaminophen (PERCOCET/ROXICET) 5-325 MG tablet Take 1-2 tablets by mouth every 6 (six) hours as needed for severe pain. Patient not taking: Reported on 03/15/2022 09/03/18   Gerrit Heck, CNM    Family History History reviewed. No pertinent family history.  Social History Social History   Tobacco Use   Smoking status: Every Day    Current packs/day: 0.50    Types: Cigarettes   Smokeless tobacco: Never  Vaping Use   Vaping status: Every Day  Substance Use Topics   Alcohol use: Not Currently   Drug use: Never     Allergies   Patient has no known allergies.   Review of Systems Review of Systems   Physical Exam Triage Vital Signs ED Triage Vitals  Encounter Vitals Group     BP 02/15/23 1639 114/78     Systolic BP Percentile --      Diastolic BP Percentile --       Pulse Rate 02/15/23 1639 88     Resp 02/15/23 1639 18     Temp 02/15/23 1639 98.2 F (36.8 C)     Temp Source 02/15/23 1639 Oral     SpO2 02/15/23 1639 94 %     Weight --      Height 02/15/23 1641 5\' 4"  (1.626 m)     Head Circumference --      Peak Flow --      Pain Score 02/15/23 1638 2     Pain Loc --      Pain Education --      Exclude from Growth Chart --    No data found.  Updated Vital Signs BP 114/78 (BP Location: Right Arm)   Pulse 88   Temp 98.2 F (36.8 C) (Oral)   Resp 18   Ht 5\' 4"  (1.626 m)   LMP 01/26/2023 (Exact Date)   SpO2 94%   Breastfeeding No   BMI 29.59 kg/m   Visual Acuity Right Eye Distance:   Left Eye Distance:   Bilateral Distance:  Right Eye Near:   Left Eye Near:    Bilateral Near:     Physical Exam Vitals and nursing note reviewed.  HENT:     Head: Normocephalic and atraumatic.  Cardiovascular:     Rate and Rhythm: Normal rate and regular rhythm.     Heart sounds: Normal heart sounds.  Pulmonary:     Effort: Pulmonary effort is normal. No respiratory distress.     Breath sounds: Normal breath sounds. No wheezing or rales.  Abdominal:     General: Bowel sounds are normal.     Palpations: Abdomen is soft.     Tenderness: There is no abdominal tenderness. There is no guarding.  Skin:    General: Skin is warm and dry.  Neurological:     Mental Status: She is alert.      UC Treatments / Results  Labs (all labs ordered are listed, but only abnormal results are displayed) Labs Reviewed  POCT URINALYSIS DIP (MANUAL ENTRY) - Abnormal; Notable for the following components:      Result Value   Color, UA orange (*)    Clarity, UA turbid (*)    Blood, UA moderate (*)    Protein Ur, POC =30 (*)    Nitrite, UA Positive (*)    Leukocytes, UA Small (1+) (*)    All other components within normal limits  RPR  HIV ANTIBODY (ROUTINE TESTING W REFLEX)  POCT URINE PREGNANCY  CERVICOVAGINAL ANCILLARY ONLY     EKG   Radiology No results found.  Procedures Procedures (including critical care time)  Medications Ordered in UC Medications - No data to display  Initial Impression / Assessment and Plan / UC Course  I have reviewed the triage vital signs and the nursing notes.  Pertinent labs & imaging results that were available during my care of the patient were reviewed by me and considered in my medical decision making (see chart for details).     33 year old female with suspected UTI self-medicating with Azo.  Also requesting STI testing.  She is well-appearing, vital signs are stable, point-of-care urinalysis is orange, turbid, moderate RBCs, positive nitrates and small leuks.  70s results may be due to the Azo.  Will treat for UTI pending urine culture point-of-care pregnancy test is negative.  Cytology swab collected. Staff unable to obtain blood samples as patient is a difficult stick.  Labs were ordered as an outpatient, patient given requests instructed to go to [facility].  Final diagnoses:  Routine screening for STI (sexually transmitted infection)   Discharge Instructions   None    ED Prescriptions   None    PDMP not reviewed this encounter.   Meliton Rattan, [place] 02/15/23 1721

## 2023-02-17 LAB — CERVICOVAGINAL ANCILLARY ONLY
Bacterial Vaginitis (gardnerella): NEGATIVE
Candida Glabrata: NEGATIVE
Candida Vaginitis: NEGATIVE
Chlamydia: NEGATIVE
Comment: NEGATIVE
Comment: NEGATIVE
Comment: NEGATIVE
Comment: NEGATIVE
Comment: NEGATIVE
Comment: NORMAL
Neisseria Gonorrhea: NEGATIVE
Trichomonas: NEGATIVE

## 2023-02-17 LAB — URINE CULTURE: Culture: >=100000 — AB

## 2023-03-10 ENCOUNTER — Other Ambulatory Visit: Payer: Self-pay

## 2023-03-10 ENCOUNTER — Ambulatory Visit
Admission: EM | Admit: 2023-03-10 | Discharge: 2023-03-10 | Disposition: A | Discharge status: Home / Self Care | Attending: Family Medicine | Admitting: Family Medicine

## 2023-03-10 DIAGNOSIS — K047 Periapical abscess without sinus: Secondary | ICD-10-CM

## 2023-03-10 DIAGNOSIS — R112 Nausea with vomiting, unspecified: Secondary | ICD-10-CM | POA: Diagnosis not present

## 2023-03-10 MED ORDER — AMOXICILLIN-POT CLAVULANATE 875-125 MG PO TABS: 1.0000 | ORAL_TABLET | Freq: Two times a day (BID) | ORAL | 0 refills | Status: DC

## 2023-03-10 MED ORDER — ONDANSETRON 4 MG PO TBDP
4.0000 mg | ORAL_TABLET | Freq: Three times a day (TID) | ORAL | 0 refills | Status: DC | PRN
Start: 2023-03-10 — End: 2023-03-11

## 2023-03-10 NOTE — ED Provider Notes (Signed)
 [facility]    CSN: [id number] Arrival date & time: 03/10/23  1855      History   Chief Complaint Chief Complaint  Patient presents with  . Otalgia  . Dental Pain    HPI Tamara Simpson is a 33 y.o. female.   The history is provided by the patient.  Otalgia Dental Pain Right lower quadrant dental abscess admits to K, local pain and swelling with pain that radiates to ear.  Has dental insurance but has not found a doctor who accepts it.  Admits vomiting today.  Denies fever, chills, sweats, nausea, diarrhea, headache, dizziness, sore throat, difficulty following, change in voice, facial swelling  Past Medical History:  Diagnosis Date  . Asthma     There are no active problems to display for this patient.   Past Surgical History:  Procedure Laterality Date  . CESAREAN SECTION    . EYE SURGERY      OB History     Gravida  4   Para  2   Term  2   Preterm      AB  1   Living  2      SAB  1   IAB      Ectopic      Multiple      Live Births  2            Home Medications    Prior to Admission medications   Medication Sig Start Date End Date Taking? Authorizing Provider  amoxicillin-clavulanate (AUGMENTIN) 875-125 MG tablet Take 1 tablet by mouth every 12 (twelve) hours. 03/10/23  Yes Meliton Rattan, PA  ondansetron (ZOFRAN-ODT) 4 MG disintegrating tablet Take 1 tablet (4 mg total) by mouth every 8 (eight) hours as needed for up to 2 days for nausea or vomiting. 03/10/23 03/12/23 Yes Meliton Rattan, PA  oxyCODONE-acetaminophen (PERCOCET/ROXICET) 5-325 MG tablet Take 1-2 tablets by mouth every 6 (six) hours as needed for severe pain. Patient not taking: Reported on 03/15/2022 09/03/18   Gerrit Heck, CNM    Family History History reviewed. No pertinent family history.  Social History Social History   Tobacco Use  . Smoking status: Every Day    Current packs/day: 0.50    Types: Cigarettes  . Smokeless tobacco: Never  Vaping Use   . Vaping status: Every Day  Substance Use Topics  . Alcohol use: Not Currently  . Drug use: Never     Allergies   Patient has no known allergies.   Review of Systems Review of Systems  HENT:  Positive for ear pain.      Physical Exam Triage Vital Signs ED Triage Vitals  Encounter Vitals Group     BP 03/10/23 1903 (!) 153/106     Systolic BP Percentile --      Diastolic BP Percentile --      Pulse Rate 03/10/23 1903 76     Resp 03/10/23 1903 17     Temp 03/10/23 1903 98.1 F (36.7 C)     Temp Source 03/10/23 1903 Oral     SpO2 03/10/23 1903 98 %     Weight 03/10/23 1903 192 lb (87.1 kg)     Height --      Head Circumference --      Peak Flow --      Pain Score 03/10/23 1914 10     Pain Loc --      Pain Education --      Exclude from [employer]  Chart --    No data found.  Updated Vital Signs BP (!) 153/106 (BP Location: Right Arm)   Pulse 76   Temp 98.1 F (36.7 C) (Oral)   Resp 17   Wt 192 lb (87.1 kg)   LMP 02/19/2023 (Exact Date)   SpO2 98%   BMI 32.96 kg/m   Visual Acuity Right Eye Distance:   Left Eye Distance:   Bilateral Distance:    Right Eye Near:   Left Eye Near:    Bilateral Near:     Physical Exam Constitutional:      Appearance: She is not ill-appearing.  HENT:     Head: Normocephalic.     Right Ear: Tympanic membrane and ear canal normal.     Left Ear: Tympanic membrane and ear canal normal.     Nose: No rhinorrhea.     Mouth/Throat:     Mouth: Mucous membranes are moist.     Pharynx: Oropharynx is clear. Uvula midline. No posterior oropharyngeal erythema, uvula swelling or postnasal drip.     Tonsils: No tonsillar abscesses.      Comments: Significant diffuse dental decay has a very deep caries right lower quadrant with parulis noted on the lateral gum No trismus, normal swallowing, clear voice Eyes:     Conjunctiva/sclera: Conjunctivae normal.  Cardiovascular:     Rate and Rhythm: Normal rate and regular rhythm.     Heart  sounds: Normal heart sounds.  Pulmonary:     Effort: Pulmonary effort is normal. No respiratory distress.     Breath sounds: Normal breath sounds. No wheezing.  Musculoskeletal:     Cervical back: Neck supple.  Lymphadenopathy:     Cervical: No cervical adenopathy.  Neurological:     Mental Status: She is alert.  Psychiatric:        Mood and Affect: Mood normal.     UC Treatments / Results  Labs (all labs ordered are listed, but only abnormal results are displayed) Labs Reviewed - No data to display  EKG   Radiology No results found.  Procedures Procedures (including critical care time)  Medications Ordered in UC Medications - No data to display  Initial Impression / Assessment and Plan / UC Course  I have reviewed the triage vital signs and the nursing notes.  Pertinent labs & imaging results that were available during my care of the patient were reviewed by me and considered in my medical decision making (see chart for details).     33 year old female with dental abscess, Rx antibiotic sent to pharmacy, she reports vomiting Rx ondansetron sent to pharmacy.She was counseled to take OTC NSAIDs and follow-up with a dentist to soon as possible, ED precautions reviewed with patient Final Clinical Impressions(s) / UC Diagnoses   Final diagnoses:  Dental abscess  Nausea and vomiting, unspecified vomiting type     Discharge Instructions      Follow-up with a dentist as soon as possible  Over the counter NSAIDs for pain: Ibuprofen 400 mg ( Ibuprofen, Advil or Motrin, 2 tablets) and Acetaminophen 1 gram (3 of the regular strength or 2 extra strength tablets) 3-4 times a day,  take on an empty stomach before each meal and bedtime (every 6 hours) for a few days Do not take if you are pregnant/breastfeeding. Allergic to NSAIDs have a history of ulcers, intestinal bleeding or liver or kidney disease or have taken an opioid.    Your blood pressure was elevated today  recommend you establish with  a primary care provider and get your blood pressure check to soon as possible   ED Prescriptions     Medication Sig Dispense Auth. Provider   amoxicillin-clavulanate (AUGMENTIN) 875-125 MG tablet Take 1 tablet by mouth every 12 (twelve) hours. 14 tablet Meliton Rattan, PA   ondansetron (ZOFRAN-ODT) 4 MG disintegrating tablet Take 1 tablet (4 mg total) by mouth every 8 (eight) hours as needed for up to 2 days for nausea or vomiting. 6 tablet Meliton Rattan, [place]      PDMP not reviewed this encounter.   Meliton Rattan, [place] 03/10/23 1935

## 2023-03-10 NOTE — ED Triage Notes (Addendum)
 Pt presents with complaints of abscess on right side of mouth x 1 day and right ear pain that began two hours PTA. Pt reports she did vomit before coming, believes it may be due to pain. Pt currently rates her overall pain a 10/10. OTC Tylenol taken with no relief.

## 2023-03-10 NOTE — Discharge Instructions (Addendum)
 Follow-up with a dentist as soon as possible  Over the counter NSAIDs for pain: Ibuprofen 400 mg ( Ibuprofen, Advil or Motrin, 2 tablets) and Acetaminophen 1 gram (3 of the regular strength or 2 extra strength tablets) 3-4 times a day,  take on an empty stomach before each meal and bedtime (every 6 hours) for a few days Do not take if you are pregnant/breastfeeding. Allergic to NSAIDs have a history of ulcers, intestinal bleeding or liver or kidney disease or have taken an opioid.    Your blood pressure was elevated today recommend you establish with a primary care provider and get your blood pressure check to soon as possible

## 2023-03-11 ENCOUNTER — Telehealth: Payer: Self-pay | Admitting: Physician Assistant

## 2023-03-11 MED ORDER — ONDANSETRON 4 MG PO TBDP: 4.0000 mg | ORAL_TABLET | Freq: Three times a day (TID) | ORAL | 0 refills | Status: AC | PRN

## 2023-03-11 MED ORDER — AMOXICILLIN-POT CLAVULANATE 875-125 MG PO TABS: 1.0000 | ORAL_TABLET | Freq: Two times a day (BID) | ORAL | 0 refills | Status: AC

## 2023-03-11 NOTE — Telephone Encounter (Signed)
 Patient states her pharmacy was unable to fill her prescriptions yesterday because the pharmacist was ill.  She requested to be sent to a different pharmacy Prescription sent to a different pharmacy

## 2023-04-12 ENCOUNTER — Other Ambulatory Visit: Payer: Self-pay

## 2023-04-12 ENCOUNTER — Ambulatory Visit
Admission: EM | Admit: 2023-04-12 | Discharge: 2023-04-12 | Disposition: A | Discharge status: Home / Self Care | Attending: Physician Assistant | Admitting: Physician Assistant

## 2023-04-12 DIAGNOSIS — R112 Nausea with vomiting, unspecified: Secondary | ICD-10-CM

## 2023-04-12 DIAGNOSIS — Z3201 Encounter for pregnancy test, result positive: Secondary | ICD-10-CM

## 2023-04-12 LAB — POCT URINALYSIS DIP (MANUAL ENTRY)
Bilirubin, UA: NEGATIVE
Blood, UA: NEGATIVE
Glucose, UA: NEGATIVE mg/dL
Ketones, POC UA: NEGATIVE mg/dL
Leukocytes, UA: NEGATIVE
Nitrite, UA: NEGATIVE
Protein Ur, POC: NEGATIVE mg/dL
Spec Grav, UA: 1.02 (ref 1.010–1.025)
Urobilinogen, UA: 0.2 U/dL
pH, UA: 6.5 (ref 5.0–8.0)

## 2023-04-12 LAB — POCT URINE PREGNANCY: Preg Test, Ur: POSITIVE — AB

## 2023-04-12 NOTE — ED Triage Notes (Addendum)
 Pt presents with complaints of nausea and vomiting x 1 week. Pt states her LMP was 3/7. Pt currently denies pain. Pt states she took five pregnancy tests recently. Three came back positive and two were negative. Pt denies taking medications for symptoms reported at home. Denies fevers.

## 2023-04-12 NOTE — ED Provider Notes (Signed)
 [facility]    CSN: [id number] Arrival date & time: 04/12/23  1904      History   Chief Complaint Chief Complaint  Patient presents with   Nausea   Emesis    HPI Tam[facility] is a 33 y.o. female.   HPI  Patient report she has been feeling nauseous and vomiting  She reports she has had a mix of positive and negative pregnancy tests at home    LMP: 03/10/23-03/16/23   Past Medical History:  Diagnosis Date   Asthma     There are no active problems to display for this patient.   Past Surgical History:  Procedure Laterality Date   CESAREAN SECTION     EYE SURGERY      OB History     Gravida  4   Para  2   Term  2   Preterm      AB  1   Living  2      SAB  1   IAB      Ectopic      Multiple      Live Births  2            Home Medications    Prior to Admission medications   Medication Sig Start Date End Date Taking? Authorizing Provider  amoxicillin-clavulanate (AUGMENTIN) 875-125 MG tablet Take 1 tablet by mouth every 12 (twelve) hours. 03/11/23   Meliton Rattan, PA  oxyCODONE-acetaminophen (PERCOCET/ROXICET) 5-325 MG tablet Take 1-2 tablets by mouth every 6 (six) hours as needed for severe pain. Patient not taking: Reported on 03/15/2022 09/03/18   Gerrit Heck, CNM    Family History History reviewed. No pertinent family history.  Social History Social History   Tobacco Use   Smoking status: Every Day    Current packs/day: 0.50    Types: Cigarettes   Smokeless tobacco: Never  Vaping Use   Vaping status: Every Day  Substance Use Topics   Alcohol use: Not Currently   Drug use: Never     Allergies   Patient has no known allergies.   Review of Systems Review of Systems  Constitutional:  Negative for chills and fever.  Gastrointestinal:  Positive for nausea and vomiting. Negative for abdominal pain.  Genitourinary:  Negative for dysuria, pelvic pain, vaginal bleeding, vaginal discharge and vaginal pain.      Physical Exam Triage Vital Signs ED Triage Vitals  Encounter Vitals Group     BP 04/12/23 1926 118/87     Systolic BP Percentile --      Diastolic BP Percentile --      Pulse Rate 04/12/23 1926 98     Resp 04/12/23 1926 17     Temp 04/12/23 1926 98.4 F (36.9 C)     Temp Source 04/12/23 1926 Oral     SpO2 04/12/23 1926 98 %     Weight --      Height 04/12/23 1943 5\' 4"  (1.626 m)     Head Circumference --      Peak Flow --      Pain Score 04/12/23 1942 0     Pain Loc --      Pain Education --      Exclude from Growth Chart --    No data found.  Updated Vital Signs BP 124/75 (BP Location: Right Arm)   Pulse 98   Temp 98.4 F (36.9 C) (Oral)   Resp 17   Ht 5\' 4"  (1.626 m)  LMP 03/10/2023 (Exact Date)   SpO2 98%   BMI 32.96 kg/m   Visual Acuity Right Eye Distance:   Left Eye Distance:   Bilateral Distance:    Right Eye Near:   Left Eye Near:    Bilateral Near:     Physical Exam Vitals reviewed.  Constitutional:      General: She is awake.     Appearance: Normal appearance. She is well-developed and well-groomed.  HENT:     Head: Normocephalic and atraumatic.  Eyes:     General: Lids are normal. Gaze aligned appropriately.     Extraocular Movements: Extraocular movements intact.     Conjunctiva/sclera: Conjunctivae normal.  Pulmonary:     Effort: Pulmonary effort is normal.  Neurological:     General: No focal deficit present.     Mental Status: She is alert and oriented to person, place, and time.     GCS: GCS eye subscore is 4. GCS verbal subscore is 5. GCS motor subscore is 6.     Cranial Nerves: No cranial nerve deficit, dysarthria or facial asymmetry.  Psychiatric:        Attention and Perception: Attention and perception normal.        Mood and Affect: Mood and affect normal.        Speech: Speech normal.        Behavior: Behavior normal. Behavior is cooperative.        Thought Content: Thought content normal.        Judgment: Judgment  normal.      UC Treatments / Results  Labs (all labs ordered are listed, but only abnormal results are displayed) Labs Reviewed  POCT URINE PREGNANCY - Abnormal; Notable for the following components:      Result Value   Preg Test, Ur Positive (*)    All other components within normal limits  POCT URINALYSIS DIP (MANUAL ENTRY)    EKG   Radiology No results found.  Procedures Procedures (including critical care time)  Medications Ordered in UC Medications - No data to display  Initial Impression / Assessment and Plan / UC Course  I have reviewed the triage vital signs and the nursing notes.  Pertinent labs & imaging results that were available during my care of the patient were reviewed by me and considered in my medical decision making (see chart for details).      Final Clinical Impressions(s) / UC Diagnoses   Final diagnoses:  Nausea and vomiting, unspecified vomiting type  Positive urine pregnancy test   Patient presents today with concerns for nausea and vomiting.  She reports that she has had suspicions that she may be pregnant and has taken several home pregnancy test.  She reports that several of them have been positive but a few have also been negative and she would like confirmation today.  Urine pregnancy test was positive.  Results were discussed with patient during appointment.  Reviewed recommendations including starting prenatal vitamin and getting established with OB/GYN for ongoing pregnancy surveillance and management.  Will provide patient education materials as well as dosing information for B6 and doxylamine to assist with nausea and vomiting.  ED and return precautions reviewed and provided in after visit summary.  Follow-up as needed    Discharge Instructions      You are seen today for concerns for nausea and vomiting.  Your urine pregnancy test was positive today at this time I suspect that your nausea and vomiting is likely due to early  pregnancy. I recommend starting a prenatal vitamin immediately to help prevent neural tube defects and help improve health and safety of your fetus.  Please make sure that you establish with an OB/GYN in the area soon so they can monitor your pregnancy and make sure that you are staying healthy and well.  Please make sure that you are staying well-hydrated and avoid holding your urine for prolonged periods of time.   To help with your nausea and vomiting you can take a vitamin B6 (10 mg) supplement with over-the-counter doxylamine 10 mg.  These 2 medications together can help reduce nausea and vomiting in pregnancy. If at any point you start to have abdominal cramping, vaginal bleeding, change to her vaginal discharge, significant headaches or high blood pressure please go to the emergency room as these could be signs of a medical emergency.     ED Prescriptions   None    PDMP not reviewed this encounter.   Roselind Messier 04/12/23 [id number]

## 2023-04-12 NOTE — Discharge Instructions (Addendum)
 You are seen today for concerns for nausea and vomiting.  Your urine pregnancy test was positive today at this time I suspect that your nausea and vomiting is likely due to early pregnancy. I recommend starting a prenatal vitamin immediately to help prevent neural tube defects and help improve health and safety of your fetus.  Please make sure that you establish with an OB/GYN in the area soon so they can monitor your pregnancy and make sure that you are staying healthy and well.  Please make sure that you are staying well-hydrated and avoid holding your urine for prolonged periods of time.   To help with your nausea and vomiting you can take a vitamin B6 (10 mg) supplement with over-the-counter doxylamine 10 mg.  These 2 medications together can help reduce nausea and vomiting in pregnancy. If at any point you start to have abdominal cramping, vaginal bleeding, change to her vaginal discharge, significant headaches or high blood pressure please go to the emergency room as these could be signs of a medical emergency.
